# Patient Record
Sex: Female | Born: 1945 | Race: White | Hispanic: No | Marital: Married
Health system: Midwestern US, Academic
[De-identification: ages and names within clinical notes are randomized; demographics above are authoritative.]

---

## 2012-01-17 ENCOUNTER — Ambulatory Visit: Admit: 2012-01-17 | Discharge: 2012-01-17 | Payer: Medicare (Managed Care)

## 2012-01-17 DIAGNOSIS — L821 Other seborrheic keratosis: Secondary | ICD-10-CM

## 2012-01-17 MED ORDER — hydroquinone (MELQUIN-HP) 4 % cream
4 | Freq: Two times a day (BID) | TOPICAL | 0.00 refills | 29.00000 days | Status: AC
Start: 2012-01-17 — End: 2013-06-30

## 2012-01-17 NOTE — Unmapped (Signed)
Subjective  HPI:   Patient ID: Audrey Lynn is a 66 y.o. female.    Chief Complaint:  Chief Complaint   Patient presents with   ??? FSE       NPV, presents with :    1. Numerous asymptomatic pigmented lesions to trunk, arms, and legs x years without change or treatments. No personal or family hx of skin cancer.      2. 2 rough, lesions left cheek x few months. No tx's    3. Lesions under breasts x over years. ? tx options    4. ? tx options to lighten dark spot left upper brow    5. ? tx options for asymptomatic  Lesion left lip x many years. States had laser treatment to remove benign lesion in this place x over 10 years ago. Healed well but with lighter color and redundant tissue.  ROS:   Review of Systems Denies fever/chills. Allergies reviewed     Objective:   Physical Exam  Derm Physical Exam:  Examination was performed of the following were unremarkable: psych/neuro, conjunctivae/eyelids, neck, breast/axilla/chest, abdomen, back, RUE, LUE, RLE, LLE and nails/digits    Abnormalities noted include: head/face and gums/teeth/lips    1. Over 50 verrucous brown stuck on papules over the trunk, arms, legs    2. 2 Ill defined non indurated keratotic papules over left cheek    3. approx 10 pink tag like papules over abdomen under breasts    4. 1cm homogenous tan round macule left lower forehead     5. 8x79mm white redundant scar like papule left mid philtrum     A Complete Skin Exam was Completed on: January 17, 2012  Assessment/Plan:   1. Multiple nevi, benign appearing  -educated on self surveillance of skin lesions with ABCDE criteria (explained)  -educated on sun avoidance and protection; new sunscreen guidelines.   -Annual FSE  -Monthly self FSE      2. AK, x 2  Liquid nitrogen was applied for 10-12 seconds to the skin lesion and the expected blistering or scabbing reaction explained. Do not pick at the area. Patient reminded to expect hypopigmented scars from the procedure. Return if lesion fails to fully  resolve.    3. Skin tag  -counseled on benign, cosmetic nature, treatments options, side effects and associated fees. Patient opts to treat with LN2 and will pay 85$ cosmetic fee.   Liquid nitrogen was applied for 10-12 seconds to the skin lesion and the expected blistering or scabbing reaction explained. Do not pick at the area. Patient reminded to expect hypopigmented scars from the procedure. Return if lesion fails to fully resolve.    4. Lentigo  -hq 4% bid for up to 2 months. Stop for any darkening    5. Scar, redundant  -offered plastic surgery eval for potential scar revision. Referral info given for dr Scharlene Corn

## 2013-02-12 ENCOUNTER — Ambulatory Visit: Admit: 2013-02-12 | Discharge: 2013-02-12 | Payer: Medicare (Managed Care)

## 2013-02-12 DIAGNOSIS — L821 Other seborrheic keratosis: Secondary | ICD-10-CM

## 2013-02-12 NOTE — Unmapped (Signed)
Subjective  HPI:   Patient ID: Audrey Lynn is a 67 y.o. female.    Chief Complaint:  Chief Complaint   Patient presents with   ??? Skin Lesion     pt in for yearly skin ck; past hx of aks         1. Numerous asymptomatic pigmented lesions to trunk, arms, and legs x years without change or treatments.     2. Non healing lesions present for several months without growth, pain, bleeding, or treatments over nasal bridge    Mother with remote hx of NMSC  ROS:   Review of Systems   Denies fever/chills. Allergies reviewed     Objective:   Physical Exam    Derm Physical Exam:  Examination was performed of the following were unremarkable: psych/neuro, conjunctivae/eyelids, neck, breast/axilla/chest, abdomen, back, RUE, LUE, RLE, LLE and nails/digits    Abnormalities noted include: head/face and gums/teeth/lips    1. Over 50 verrucous brown stuck on papules over the trunk, arms, legs    2. 5, 3-38mm Ill defined non indurated keratotic papules over left nasal bridge and sidewall    A Complete Skin Exam was Completed on: February 12, 2013  Assessment/Plan:   1. SK  -benign. Observe for associated changes in size, color, shape, pain or itching  -educated on self surveillance of skin lesions with ABCDE criteria (explained)  -educated on sun avoidance and protection; new sunscreen guidelines ( broad spectrum sun screen with SPF 30 or greater q2 hours while exposed, q 1hours if getting wet or sweating a lot).  -Annual FSE  -Monthly self FSE     2. AK, x 5  Cryotherapy x 10 secs  each x 5 lesions total.  Pt was counselled on procedure method, risks (to include pain, burning, stinging, blistering, discoloration, recurrence of lesion, or incomplete removal of lesion), benefits, and methods of care for site (avoid popping blisters; if blister pops, treat with an otc healing oint such as vaseline and cover with bandaid, change dressing daily until healed; avoid picking or scratching lesions).  Pt was advised to follow up if lesion(s)  is/are not resolved after healed in 2-4 weeks or if lesion(s) recur.  Pt was advised to call clinic or follow up sooner if worsening pain, blistering, bleeding, or any other significant problem occurs.

## 2013-06-30 ENCOUNTER — Ambulatory Visit: Admit: 2013-06-30 | Discharge: 2013-06-30 | Payer: Medicare (Managed Care)

## 2013-06-30 DIAGNOSIS — L309 Dermatitis, unspecified: Secondary | ICD-10-CM

## 2013-06-30 MED ORDER — fluocinonide (LIDEX) 0.05 % external solution
0.05 | Freq: Two times a day (BID) | TOPICAL | Status: AC
Start: 2013-06-30 — End: ?

## 2013-06-30 NOTE — Unmapped (Signed)
Subjective  HPI:   Patient ID: Audrey Lynn is a 68 y.o. female.    Chief Complaint:  Chief Complaint   Patient presents with   ??? Skin Lesion     FSE         ROS:   Review of Systems      Objective:   Physical ExamSubjective  HPI:     1. Numerous asymptomatic pigmented lesions to trunk, arms, and legs x years without change or treatments.     2. Non healing lesions present for several months without growth, pain, bleeding, or treatments over nasal bridge    3. ? 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 Well controlled w/ unk. Topical in past     Mother with remote hx of NMSC  Travelling to [place] and [place]. In next month  ROS:   Review of Systems   Denies fever/chills. Allergies reviewed     Objective:   Physical Exam    Derm Physical Exam:  Examination was performed of the following were unremarkable: psych/neuro, conjunctivae/eyelids, neck, breast/axilla/chest, abdomen, back, RUE, LUE, RLE, LLE and nails/digits    Abnormalities noted include: head/face and gums/teeth/lips    1. Over 50 verrucous brown stuck on papules over the trunk, arms, legs    2. 5, 3-7mm Ill defined non indurated keratotic papules over left nasal bridge and sidewall, lateral left cheek    3. 12mm Erythematous pearly papule over left upper back    4. 1cm Erythematous hyperkeratotic papule over the left upper arm    5. Thin Erythematous gray scaling patches over right posterior scalp     A Complete Skin Exam was Completed on: 06/30/2013  Assessment/Plan:   1. SK  -benign. Observe for associated changes in size, color, shape, pain or itching  -educated on self surveillance of skin lesions with ABCDE criteria (explained)  -educated on sun avoidance and protection; new sunscreen guidelines ( broad spectrum sun screen with SPF 30 or greater q2 hours while exposed, q 1hours if getting wet or sweating a lot).  -Annual FSE  -Monthly self FSE     2. AK  -plan for AK at f/u, declines today    3. Neoplasm of uncertain behavior, r.o bcc  -Shave  biopsy performed, left upper back. After cleaning the biopsy site with rubbing alcohol, the site was anesthetized with lidocaine 1% w/ epi 1:100,000. Potential side such as bleeding, scar, infection were reviewed. Hemostasis ensured with aluminum chloride. Wound care instructions and supplies given to patient. Specimen labeled and submitted for histological exam.     4. Neoplasm of uncertain behavior, r/o ISK vs SCC  -Shave biopsy performed, left upper arm . After cleaning the biopsy site with rubbing alcohol, the site was anesthetized with lidocaine 1% w/ epi 1:100,000. Potential side such as bleeding, scar, infection were reviewed. Hemostasis ensured with aluminum chloride. Wound care instructions and supplies given to patient. Specimen labeled and submitted for histological exam.      5. Scalp ps  -edu  -lidex sol bid. Limit use 2-3 weeks to avoid lightening/thinning of skin, tachyphylaxis. Avoid face, axilla, and groin.     rtc 2 mo's

## 2013-07-01 NOTE — Unmapped (Signed)
Pt called at pharmacy and states you were going to call in rx for rash on left leg.

## 2013-07-01 NOTE — Unmapped (Signed)
We determine she would use the solution in her scalp for her leg, did she not receive this (lidex)?

## 2013-07-02 NOTE — Unmapped (Signed)
To use soln on leg also.

## 2013-07-03 NOTE — Unmapped (Signed)
Unclear what patients wishes are. At time of visit we discussed her using the lidex for a dermatitic patch on her leg; if she desires alternate therapy we can call in TAC 0.1 cr bid prn 30g in this event.

## 2013-07-03 NOTE — Unmapped (Signed)
Left voice message for patient that at her visit patient and Pennie Rushing discussed and agreed that she would use the solution on her legs as well as her scalp.  If she has any further questions she can call back [phone] and have the staff send a message directly to me and I will contact her.

## 2013-07-04 NOTE — Unmapped (Signed)
Quick Note:    Patient informed biopsy shows superficial basal cell carcinoma. Scheduled for curettage 08/22/13 at 2:15. Patient has two vacations she will be going on and advised it was best to wait until she return on 08/19/13  ______

## 2013-07-04 NOTE — Unmapped (Signed)
Spoke with patient.

## 2013-07-04 NOTE — Unmapped (Signed)
Patient calling Elnita Maxwell back. Please call after 1pm

## 2013-07-04 NOTE — Unmapped (Signed)
Quick Note:    Left message on patients voice mail to call office. Requested that when she calls [phone] to have the staff send me a message of best phone Number and time of day that I can reach her.  ______

## 2013-08-26 ENCOUNTER — Ambulatory Visit: Admit: 2013-08-26 | Discharge: 2013-08-26

## 2013-08-26 DIAGNOSIS — C44519 Basal cell carcinoma of skin of other part of trunk: Secondary | ICD-10-CM

## 2013-08-26 NOTE — Unmapped (Signed)
Subjective  HPI:   Patient ID: Audrey Lynn is a 68 y.o. female.    Chief Complaint:  Chief Complaint   Patient presents with   ??? Skin Cancer     pt in for tx of bx proven sbcc lt upper back       Here for tx of bx proven sup. bcc over the left upper back    Denies pacemaker, defibrillator, artificial Joints, need for SBE prophylaxis, anticoagulant use.   ROS:   Review of Systems    Denies fever/chills. Allergies reviewed    Objective:   Physical Exam  Derm Physical Exam:  Examination was performed of the following were unremarkable: psych/neuro    Abnormalities noted include: back    11 mm pink round scaly patch over left upper back  Assessment/Plan:   1. bx proven sup. BCC over left upper back  -tx options discussed  After the correct patient and site was verified, informed consent was obtained. Side effects such as bleeding, infection, scar, and recurrence were discussed. The site was then cleansed with chlorhexadine scrub then anesthetized with lidocaine 1% w epi 1:100,000. Curettage performed. Hemostasis was ensured. EBL < 5ml. Wound care supplies and instructions given to patient. Final defect 2.2 cm. RTC 3 months for skin cancer screening.

## 2013-11-17 ENCOUNTER — Ambulatory Visit: Admit: 2013-11-17 | Discharge: 2013-11-17

## 2013-11-17 DIAGNOSIS — L821 Other seborrheic keratosis: Secondary | ICD-10-CM

## 2013-11-17 NOTE — Unmapped (Signed)
Subjective  HPI:   Patient ID: Audrey Lynn is a 68 y.o. female.    Chief Complaint:  Chief Complaint   Patient presents with   ??? Skin Cancer     3 mth f/u of curettage of bcc left upper back         ROS:   Review of Systems    Objective:   Physical Exam  Subjective  HPI:       1. Numerous asymptomatic pigmented lesions to trunk, arms, and legs x years without change or treatments.     2. S/P curettage sup. BCC left upper back 08/2013. Site well healed.    3. Scalp psoriasis well controlled with lidex sol. Pleased.     Mother with remote hx of NMSC  Travelling to [place] and [place]. In next month  ROS:   Review of Systems   Denies fever/chills. Allergies reviewed     Objective:   Physical Exam    Derm Physical Exam:  Examination was performed of the following were unremarkable: psych/neuro, conjunctivae/eyelids, neck, breast/axilla/chest, abdomen, back, RUE, LUE, RLE, LLE and nails/digits    Abnormalities noted include: head/face and gums/teeth/lips    1. Over 50 verrucous brown stuck on papules over the trunk, arms, legs    2. 5, 3-20mm Ill defined non indurated keratotic papules over left nasal bridge and sidewall, lateral left cheek    3. 1.2 cm depressed scar over left upper back - no sign of recurrence.     4. Clear     A Complete Skin Exam was Completed on: 06/30/2013  Assessment/Plan:   1. SK  -benign. Observe for associated changes in size, color, shape, pain or itching  -educated on self surveillance of skin lesions with ABCDE criteria (explained)  -educated on sun avoidance and protection; new sunscreen guidelines ( broad spectrum sun screen with SPF 30 or greater q2 hours while exposed, q 1hours if getting wet or sweating a lot).  -Annual FSE  -Monthly self FSE     2. AK  Cryotherapy x 10 secs  each x 5 lesions total.  Pt was counselled on procedure method, risks (to include pain, burning, stinging, blistering, discoloration, recurrence of lesion, or incomplete removal of lesion), benefits, and methods of  care for site (avoid popping blisters; if blister pops, treat with an otc healing oint such as vaseline and cover with bandaid, change dressing daily until healed; avoid picking or scratching lesions).  Pt was advised to follow up if lesion(s) is/are not resolved after healed in 2-4 weeks or if lesion(s) recur.  Pt was advised to call clinic or follow up sooner if worsening pain, blistering, bleeding, or any other significant problem occurs.      3. Hx of BCC, no sign of recurrence  -educated on self surveillance of skin lesions with ABCDE criteria (explained)  -educated on sun avoidance and protection; new sunscreen guidelines ( broad spectrum sun screen with SPF 30 or greater q2 hours while exposed, q 1hours if getting wet or sweating a lot).  -Annual FSE  -Monthly self FSE      4. Scalp ps, clear  -edu  -lidex sol bid. Limit use 2-3 weeks to avoid lightening/thinning of skin, tachyphylaxis. Avoid face, axilla, and groin.     rtc 6 mo's

## 2015-01-26 ENCOUNTER — Ambulatory Visit: Admit: 2015-01-26 | Discharge: 2015-01-26 | Payer: Medicare (Managed Care)

## 2015-01-26 DIAGNOSIS — D485 Neoplasm of uncertain behavior of skin: Secondary | ICD-10-CM

## 2015-01-26 NOTE — Unmapped (Signed)
[facility] Dermatology  Clinic Visit    Patient's Name:                 Audrey Lynn  Medical Record Number:  [id number]  Date of Birth:                  [birth year]  Date of Visit:                  01/26/2015  Referring Provider:          Vernetta Honey, MD  Last dermatology clinic visit: 11/17/13    Chief Complaint   Patient presents with   ??? Basal Cell Carcinoma     skin exam       HPI: This is a Dermatology Clinic visit for Audrey Lynn who is a(n) 69 y.o. old female who is here today for evaluation of the following:    -tender bump on L shin that suddenly popped up 2-3 months ago, no prior treatment  -also with rough bumps on her nose and L cheek and R dorsal hand x several months, the one on the R hand is intermittently tender; has a friend's 70th birthday party this weekend so doesn't want her face to be red and crusted after treatment  -h/o curettage for sup. BCC left upper back 08/2013. Site well healed but patient unable with scar  -rough, itchy bumps under breasts x several months, similar lesions prev treated with LN2 with good results    The patient otherwise denies any new, changing, painful, bleeding, non-healing lesions. No other skin concerns today.    Derm History:  Personal history of NMSC or MM- yes  Family history of melanoma- no  sunburns easily- no  uses sunscreen- yes  history of tanning bed use- yes    PAST MEDICAL HISTORY:   Past Medical History   Diagnosis Date   ??? Varicella    ??? Dermatitis    ??? Thyroid disease    ??? Eczema    ??? Hypercholesteremia    ??? Actinic keratosis    ??? Basal cell carcinoma 08/2013     lt upper back-superficial       MEDICATIONS:  Current Outpatient Prescriptions   Medication Sig Dispense Refill   ??? atenolol (TENORMIN) 25 MG tablet Take 25 mg by mouth daily.     ??? atorvastatin (LIPITOR) 20 MG tablet      ??? cranberry conc-ascorbic acid 4,200-20 mg Cap Take by mouth.     ??? fluocinonide (LIDEX) 0.05 % external solution Apply topically 2 times a day. 60 mL 1   ???  levothyroxine (SYNTHROID, LEVOTHROID) 25 MCG tablet      ??? liothyronine (CYTOMEL) 25 MCG tablet Take 25 mcg by mouth daily.     ??? lisinopril (PRINIVIL,ZESTRIL) 40 MG tablet      ??? MIRABEGRON (MYRBETRIQ ORAL) Take 25 mg by mouth.     ??? multivitamin with minerals tablet Take 1 tablet by mouth daily.     ??? estradiol (ESTRACE) 0.01 % (0.1 mg/gram) vaginal cream Place 2 g vaginally daily.     ??? oxybutynin (DITROPAN-XL) 10 MG 24 hr tablet Take 10 mg by mouth daily.       No current facility-administered medications for this visit.   Marland Kitchen    ALLERGIES:  No Known Allergies    FAMILY HISTORY:  Family History   Problem Relation Age of Onset   ??? Melanoma Neg Hx    ??? Skin  cancer Mother        SOCIAL HISTORY:  Social History     Social History   ??? Marital Status: Married     Spouse Name: N/A   ??? Number of Children: N/A   ??? Years of Education: N/A     Social History Main Topics   ??? Smoking status: Never Smoker    ??? Smokeless tobacco: None   ??? Alcohol Use: No   ??? Drug Use: None   ??? Sexual Activity: Not Asked     Other Topics Concern   ??? Seat Belt Yes     Social History Narrative       REVIEW OF SYSTEMS: +for skin, negative for fevers/chills/nausea/vomiting/weight loss, fatigue    PHYSICAL EXAMINATION:   Notable for a well appearing female with photo skin type II/III.  A comprehensive skin examination of the scalp/hair, head/face, conjunctivae/lids, oropharynx, lips/teeth/gums, neck, back, chest/breast/axillae, abdomen, buttocks, digits/nails, upper extremities, and lower extremities was performed and was within normal limits apart from as documented below:  -L shin with 0.8 cm erythematous crusted crateriform papule  -L upper back with well-healed white atrophic round scar  -inframammary skin with multiple rough brown stuck-on thin papules  -R dorsal hand with erythematous gritty thin papule  -erythematous gritty thin papules on the nose x 4, L cheek x 2    ASSESSMENT AND PLAN:    1. Neoplasm of uncertain behavior of skin, L  shin  Ddx: favor SCC ([facility]) > other  -shave excision attempted today; procedure note below    Shave Biopsy Procedure Note:   The risks (including bleeding, wound infection, scar), benefits, and alternatives to the procedure were discussed with the patient. Patient provided verbal informed consent. Three patient identifiers were verified including name, date of birth, and medical record number. Time-out was performed. The area was cleansed with alcohol and anesthetized with 1% lidocaine with epinephrine 1:100,000.  A complete shave biopsy was performed with Dermablade. Sample(s) sent to Pathology.  Aluminum chloride 20% used for hemostasis. Topical ointment and bandaid were applied. Procedure was well tolerated. If site bleeds, patient instructed to apply pressure x 15 min and to seek medication attention if this does not control bleeding.      2. Actinic keratoses, R dorsal hand, nose x 4, L cheek x 2  - Natural history reviewed including small risk of conversion to squamous cell carcinoma over time  - Importance of self-skin exam and photoprotection reviewed  - Discussed benefits and risks of liquid nitrogen treatment, including but not limited to scarring, postinflammatory pigment changes, blistering, pain, infection  - Pt opts for treatment; procedure note as below.    Cryotherapy procedure note:  - LN2 administered for 8-10 sec x 1 cycle x 1 lesion--R dorsal hand, defers treatment of face lesions to f/u visit given upcoming parties  - Discussed post-LN2 care and natural hx of PIH and expected time course for improvement.    3. Inflamed seborrheic keratoses, inframammary  - reassured patient regarding benign nature of lesion  - no treatment medically necessary  - discussed option to treat symptomatic lesions with liquid nitrogen therapy, patient opted for treatment: LN2 administered for 12 sec x 1 cycle x 10 lesions, SE???s reviewed, post-LN2 care reviewed.    4. Scar condition and fibrosis of skin  5. History of  basal cell cancer, L upper back s/p [facility] 08/2013  -NER/NED  -reassurance provided  -importance of photoprotection was reviewed including wearing broad spectrum sunscreen or sunblock, physical  barrier protection such as wide brimmed hats and long sleeved shirts, and avoiding peak sun exposure from 10am to 4pm    Return to Clinic: 3 months, sooner prn  Discussed plan with patient and/or primary caretaker.  Patient to call clinic with any questions / concerns.  Reviewed side effects of treatment(s) and/or medication(s) with patient and/or primary caretaker.  Handout provided: AVS    Kinsey Cowsert Beckey Rutter, MD

## 2015-02-01 NOTE — Unmapped (Signed)
Quick Note:    Reviewed results of recent biopsy located on the L shin with patient over the phone.   Final pathology result: superficial SCC.  Reviewed malignant nature of lesion and recommendation for ED&C vs. Surgical excision (discussed may still need to heal with secondary intent given location on the shin); patient opts for the former--f/u scheduled for 04/20/14, so will treat at that time but pt to notify me if growing, painful, symptomatic prior to that time and will schedule sooner--patient is in agreement with this plan  Also counseled importance of follow-up TBSE in three months--scheduled as above.  All questions answered.    Houston Surges Beckey Rutter, MD      ______

## 2015-04-13 ENCOUNTER — Ambulatory Visit: Admit: 2015-04-13 | Discharge: 2015-04-13 | Payer: Medicare (Managed Care)

## 2015-04-13 DIAGNOSIS — IMO0002 Reserved for concepts with insufficient information to code with codable children: Secondary | ICD-10-CM

## 2015-04-13 MED ORDER — triamcinolone (KENALOG) 0.1 % ointment
0.1 | TOPICAL | 0.00 refills | 1.00000 days | Status: AC
Start: 2015-04-13 — End: ?

## 2015-04-13 NOTE — Unmapped (Signed)
[facility] Dermatology  Clinic Visit    Patient's Name:                 Audrey Lynn  Medical Record Number:  [id number]  Date of Birth:                  Nov 16, 1945  Date of Visit:                  04/13/2015  Referring Provider:          Vernetta Honey, MD  Last dermatology clinic visit: 01/26/15    Chief Complaint   Patient presents with   ??? Skin Lesion     pt here for fse and tx of L knee   ??? Rash     x 1 week coincides with d/c of thyroid medicine redness upper chest/neck     HPI: This is a Dermatology Clinic visit for Audrey Lynn who is a(n) 70 y.o. 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 Also presents for treatment of AKs on her face--deferred treatment at last visit given upcoming social event.  -also with a new red, itchy rash on her upper chest/anteroir neck x 5 days, no new meds, lotions, perfume, jewelry; stopped thyroid medication ~ 1 week ago, no treatment tried  -rough bump on R dorsal hand treated with LN2 last visit is thinner but didn't completely resolve, occasionally tender  -also notes bumps under her skin on her lower lip vermilion border x several months, asymptomatic, no prior treatment  -h/o curettage for sup. BCC left upper back 08/2013. Site well healed but patient unable with scar  -traveling to [place] first week in April and [place] for 3 weeks in June    The patient otherwise denies any new, changing, painful, bleeding, non-healing lesions. No other skin concerns today.    Derm History:  Personal history of NMSC or MM- yes  Family history of melanoma- no  sunburns easily- no  uses sunscreen- yes  history of tanning bed use- yes    PAST MEDICAL HISTORY:   Past Medical History   Diagnosis Date   ??? Varicella    ??? Dermatitis    ??? Thyroid disease    ??? Eczema    ??? Hypercholesteremia    ??? Actinic keratosis    ??? Basal cell carcinoma 08/2013     lt upper back-superficial       MEDICATIONS:  Current  Outpatient Prescriptions   Medication Sig Dispense Refill   ??? atenolol (TENORMIN) 25 MG tablet Take 25 mg by mouth daily.     ??? atorvastatin (LIPITOR) 20 MG tablet      ??? cranberry conc-ascorbic acid 4,200-20 mg Cap Take by mouth.     ??? estradiol (ESTRACE) 0.01 % (0.1 mg/gram) vaginal cream Place 2 g vaginally daily.     ??? fluocinonide (LIDEX) 0.05 % external solution Apply topically 2 times a day. 60 mL 1   ??? liothyronine (CYTOMEL) 25 MCG tablet Take 25 mcg by mouth daily.     ??? lisinopril (PRINIVIL,ZESTRIL) 40 MG tablet      ??? MIRABEGRON (MYRBETRIQ ORAL) Take 25 mg by mouth.     ??? multivitamin with minerals tablet Take 1 tablet by mouth daily.     ??? oxybutynin (DITROPAN-XL) 10 MG 24 hr tablet Take 10 mg by mouth daily.     ???  triamcinolone (KENALOG) 0.1 % ointment Apply to rash on chest twice daily as needed. Too strong for face, groin, underarms 80 g 1     No current facility-administered medications for this visit.   Marland Kitchen    ALLERGIES:  No Known Allergies    FAMILY HISTORY:  Family History   Problem Relation Age of Onset   ??? Melanoma Neg Hx    ??? Skin cancer Mother        SOCIAL HISTORY:  Social History     Social History   ??? Marital Status: Married     Spouse Name: N/A   ??? Number of Children: N/A   ??? Years of Education: N/A     Social History Main Topics   ??? Smoking status: Never Smoker    ??? Smokeless tobacco: None   ??? Alcohol Use: No   ??? Drug Use: None   ??? Sexual Activity: Not Asked     Other Topics Concern   ??? Seat Belt Yes     Social History Narrative       REVIEW OF SYSTEMS: +for skin, negative for fevers/chills/nausea/vomiting/weight loss, fatigue    PHYSICAL EXAMINATION:   Notable for a well appearing female with photo skin type II/III.  A skin examination of the scalp/hair, head/face, conjunctivae/lids, oropharynx, lips, neck, back, chest/breast/axillae, abdomen, buttocks, digits/nails, upper extremities, and lower extremities was performed and was within normal limits apart from as documented below:  -L shin  with 1.2 cm erythematous patch with overlying mild scale  -L upper back with well-healed white depressed round scar  -R dorsal hand with erythematous gritty thin papule  -erythematous gritty thin papules on the nose x 4, L cheek x 1, temple x 1  -lower vermilion border with 2 mm deep-seated yellow papules x 2  -upper chest extending to anterior neck with ill-defined erythematous scaly thin plaques    ASSESSMENT AND PLAN:    1. Superficially invasive SCC, L shin, s/p bx 01/26/15  -pathology reviewed with patient today  -ED&C today, procedure note below    [facility] Procedure Note:   The risks (including bleeding, wound infection, scar), benefits, and alternatives to the procedure were discussed with the patient. Patient provided written informed consent. Time-out was performed. The area was cleansed with alcohol and anesthetized with 1% lidocaine with 1:100,000 epinephrine. The lesion was scraped with a 4.0 curette, followed by hyfercation, with a first pass diameter of 1.2 cm. Procedure was repeated 3 times total.  Hemostasis was achieved with hyfercation. Procedure was well tolerated.  Topical ointment and bandaid were applied. If site bleeds, patient instructed to apply pressure x 15 min and to seek medication attention if this does not control bleeding. Wound care reviewed.    2. Actinic keratoses, R dorsal hand, nose x 4, L cheek x 1, L temple x 1  - Natural history reviewed including small risk of conversion to squamous cell carcinoma over time  - Importance of self-skin exam and photoprotection reviewed  - Discussed benefits and risks of liquid nitrogen treatment, including but not limited to scarring, postinflammatory pigment changes, blistering, pain, infection  - Pt opts for treatment; procedure note as below.    Cryotherapy procedure note:  - LN2 administered for 8-10 sec x 1 cycle x 7 lesions  - Discussed post-LN2 care and natural hx of PIH and expected time course for improvement.    3. Dermatitis, upper  chest and neck, favor contact dermatitis  - reviewed benign but chronic nature of disease  -  gentle skin care recommendations:  - use mild cleanser (e.g. unscented Dove for sensitive skin)   - use emollient post-bathing (e.g. [employer]???s store brand Equate cream or petroleum jelly)  - avoid products with fragrance  - topical steroid side effects and use for active lesions only was reviewed   - start triamcinolone 0.1% ointment applied to AA BID (avoid use on face, neck, armpit, and groin)    4. Milia, lower vermilion border  - reassured patient regarding benign nature of lesions  - no treatment medically necessary  - discussed option to treat lesions by opening roof with sterile needle and extracting content with lateral pressure for a cosmetic fee of $100/10 lesions, and patient declines treatment today.    5. Scar condition and fibrosis of skin  6. History of basal cell cancer, L upper back s/p [facility] 08/2013  -NER/NED  -reassurance provided  -importance of photoprotection was reviewed including wearing broad spectrum sunscreen or sunblock, physical barrier protection such as wide brimmed hats and long sleeved shirts, and avoiding peak sun exposure from 10am to 4pm    Return to Clinic: 4-5 months, sooner prn  Discussed plan with patient and/or primary caretaker.  Patient to call clinic with any questions / concerns.  Reviewed side effects of treatment(s) and/or medication(s) with patient and/or primary caretaker.  Handout provided: AVS    Morse Brueggemann Beckey Rutter, MD

## 2015-08-24 ENCOUNTER — Ambulatory Visit: Admit: 2015-08-24 | Discharge: 2015-08-24 | Payer: Medicare (Managed Care)

## 2015-08-24 DIAGNOSIS — L570 Actinic keratosis: Secondary | ICD-10-CM

## 2015-08-24 NOTE — Unmapped (Signed)
[facility] Dermatology  Clinic Visit    Patient's Name:                 Audrey Lynn  Medical Record Number:  [id number]  Date of Birth:                  August 30, 1945  Date of Visit:                  08/24/2015  Referring Provider:          Vernetta Honey, MD  Last dermatology clinic visit: 04/13/15    Chief Complaint   Patient presents with   ??? Skin Lesion     lesions on hairline and on nose      HPI: This is a Dermatology Clinic visit for Audrey Lynn who is a(n) 70 y.o. old female who is here today for f/u.    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 BCC left upper back 08/2013. Site well healed but patient unable with scar    The patient otherwise denies any new, changing, painful, bleeding, non-healing lesions. No other skin concerns today.    Derm History:  Personal history of NMSC or MM- yes  Family history of melanoma- no  sunburns easily- no  uses sunscreen- yes  history of tanning bed use- yes    PAST MEDICAL HISTORY:   Past Medical History   Diagnosis Date   ??? Varicella    ??? Dermatitis    ??? Thyroid disease    ??? Eczema    ??? Hypercholesteremia    ??? Actinic keratosis    ??? Basal cell carcinoma 08/2013     lt upper back-superficial       MEDICATIONS:  Current Outpatient Prescriptions   Medication Sig Dispense Refill   ??? atenolol (TENORMIN) 25 MG tablet Take 25 mg by mouth daily.     ??? atorvastatin (LIPITOR) 20 MG tablet      ???  cranberry conc-ascorbic acid 4,200-20 mg Cap Take by mouth.     ??? estradiol (ESTRACE) 0.01 % (0.1 mg/gram) vaginal cream Place 2 g vaginally daily.     ??? fluocinonide (LIDEX) 0.05 % external solution Apply topically 2 times a day. 60 mL 1   ??? liothyronine (CYTOMEL) 25 MCG tablet Take 25 mcg by mouth daily.     ??? lisinopril (PRINIVIL,ZESTRIL) 40 MG tablet      ??? MIRABEGRON (MYRBETRIQ ORAL) Take 25 mg by mouth.     ??? multivitamin with minerals tablet Take 1 tablet by mouth daily.     ??? oxybutynin (DITROPAN-XL) 10 MG 24 hr tablet Take 10 mg by mouth daily.     ??? triamcinolone (KENALOG) 0.1 % ointment Apply to rash on chest twice daily as needed. Too strong for face, groin, underarms 80 g 1     No current facility-administered medications for this visit.   Marland Kitchen  ALLERGIES:  Allergies   Allergen Reactions   ??? Latex        FAMILY HISTORY:  Family History   Problem Relation Age of Onset   ??? Melanoma Neg Hx    ??? Skin cancer Mother        SOCIAL HISTORY:  Social History     Social History   ??? Marital Status: Married     Spouse Name: N/A   ??? Number of Children: N/A   ??? Years of Education: N/A     Social History Main Topics   ??? Smoking status: Never Smoker    ??? Smokeless tobacco: None   ??? Alcohol Use: No   ??? Drug Use: None   ??? Sexual Activity: Not Asked     Other Topics Concern   ??? Seat Belt Yes     Social History Narrative       REVIEW OF SYSTEMS: +for skin, negative for fevers/chills/nausea/vomiting/weight loss, fatigue    PHYSICAL EXAMINATION:   Notable for a well appearing female with photo skin type II/III.  A skin examination of the scalp/hair, head/face, conjunctivae/lids, lips, neck, back, chest/breast/axillae, abdomen, buttocks, digits/nails, upper extremities, and lower extremities was performed and was within normal limits apart from as documented below:  -L shin with well-healed scar without nodularity  -L upper back with well-healed white depressed round scar  -erythematous gritty thin papules on the nose x 3, R  temple, L cheek  -mildly erythematous waxy brown papules on the L preauricular cheek and L frontal scalp; non-erythematous waxy brown stuck-on papule on the R neck and several similar papules scattered on trunk  -trunk, incl L buttock, and extremities with scattered 3-6 mm brown macules and thin papules with regular borders and even pigmentation  -trunk and extremities with scattered 1-3 mm bright red papules c/w cherry angiomas    ASSESSMENT AND PLAN:    1. Actinic keratoses, nose x 3, R temple, L cheek  - Natural history reviewed including small risk of conversion to squamous cell carcinoma over time  - Importance of self-skin exam and photoprotection reviewed  - Discussed benefits and risks of liquid nitrogen treatment, including but not limited to scarring, postinflammatory pigment changes, blistering, pain, infection  - Pt opts for treatment; procedure note as below.    Cryotherapy procedure note:  - LN2 administered for 8-10 sec x 1 cycle x 5 lesions  - Discussed post-LN2 care and natural hx of PIH and expected time course for improvement.    2. Seborrheic keratoses, inflamed on L preauricular cheek and L frontal scalp; not inflamed on R neck and trunk  - reassured patient regarding benign nature of lesion  - no treatment medically necessary  -discussed option to treat symptomatic lesions with liquid nitrogen therapy, patient opted for treatment: LN2 administered for 12 sec x 1 cycle x 2 lesions, SE???s reviewed, post-LN2 care reviewed.    3. Cherry angiomas  - diagnosis and etiology reviewed  - reassured patient regarding benign nature of lesions  - no treatment medically necessary    4. Multiple benign nevi, few on trunk and ext  - Patient reassured regarding benign nature of the lesions based on TODAY's exam  - Patient counseled that lesions can change over time and to RTC if any lesion grows rapidly, bleeds, develops multiple colors, or becomes painful as this may represent malignant change.  -  Sunprotection reviewed with patient.    5. Scar condition and fibrosis of skin  6. History of  basal cell cancer, L upper back s/p ED&C 08/2013 and SCC on L shin s/p [facility] 04/13/15  -NER/NED  -reassurance provided  -importance of photoprotection was reviewed    Return to Clinic: 6 months, sooner prn  Discussed plan with patient and/or primary caretaker.  Patient to call clinic with any questions / concerns.  Reviewed side effects of treatment(s) and/or medication(s) with patient and/or primary caretaker.  Handout provided: AVS    Yassmin Binegar Beckey Rutter, MD

## 2021-11-21 NOTE — Unmapped (Signed)
[facility]    Patient name: Audrey Lynn  Date of Birth: [birth year]    MRN: [id number]  CSN: [id number]    Attending: Jill Alexanders A. Cox, MD  Service: Urology    Date of procedure: 11/21/2021      Operative Note:    Pre-op Diagnosis: Refractory urgency, frequency, and urge incontinence    Post-op Diagnosis: Same    Procedure(s):  Cystoscopy with injection of Botox    Surgeon(s): Justin A. Cox, MD    Anesthesia: MAC    Specimens: none    Indications for Procedure: [name] is a 76 y.o. year old female with a  history of refractory urgency, frequency, and urge incontinence which has failed  to respond to medical management here for evaluation. After all risks, benefits  and expected outcomes were explained, she agreed to proceed.    Details of the Procedure: Ms. Rudder was brought the the operative suite. He was  given a/an MAC anesthesia without complication. She was then put in the  lithotomy position, and was prepped and draped in the usual sterile fashion. At  this point, a 21 french rigid cystoscope with a 30 degree lens was placed at the  urethral meatus and passed down the urethra and into the bladder. The urethra  was noted to be normal. Once into the bladder, the bladder was drained and  refilled, and cystoscopic examination was performed. The ureteral orifices were  normal in size, position caliber, with clear efflux from both. There were no  suspicious lesions noted at the base, the back wall, the lateral walls or the  dome. This was confirmed with a 70 degree lens. The 30 degree lens was replaced,  and then using a collagen injection needle, a total of 200 units of Botox in  30cc of saline was injected into the muscle layer of the bladder, one cc per  injection, 30 injections total.  Hemostasis was achieved. At this point, the  bladder was drained, the cystoscope was removed, and the patient was taken to  recovery in stable condition.    Complications:  none    Disposition: home

## 2022-12-13 IMAGING — MR MRI THORACIC SPINE WITHOUT CONTRAST
5 of 9 series · 18 of 48 positions shown · IV contrast (gadolinium)
Comparison: MRI lumbar spine December 13, 2022. Comparison to thoracic spine and 
lumbar spine x-rays November 29, 2022.

________________________________________________________________________________________________ 
MRI THORACIC SPINE WITHOUT CONTRAST, 12/13/2022 [DATE]: 
CLINICAL INDICATION: Low back pain, unspecified. Pain in thoracic spine
TECHNIQUE: Multiplanar, multiecho position MR images of the thoracic spine were 
performed without intravenous gadolinium enhancement. Patient was scanned on a 
1.5T magnet.

[Series 201: t2_sag_count · sagittal · 4.0mm · 0.62mm/px · 3 of 22 slices shown]
[im 1/22]
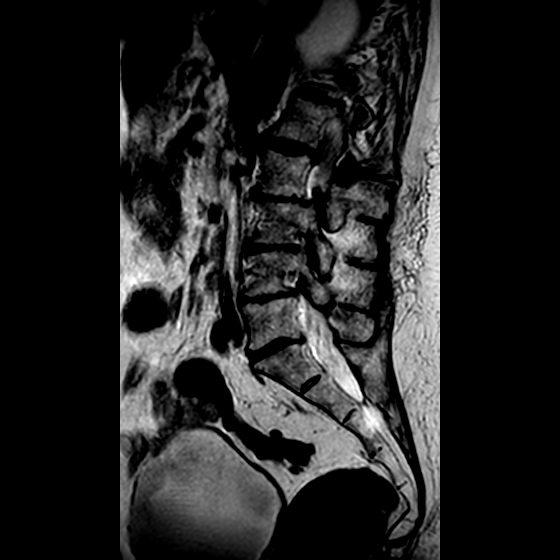
[im 11/22]
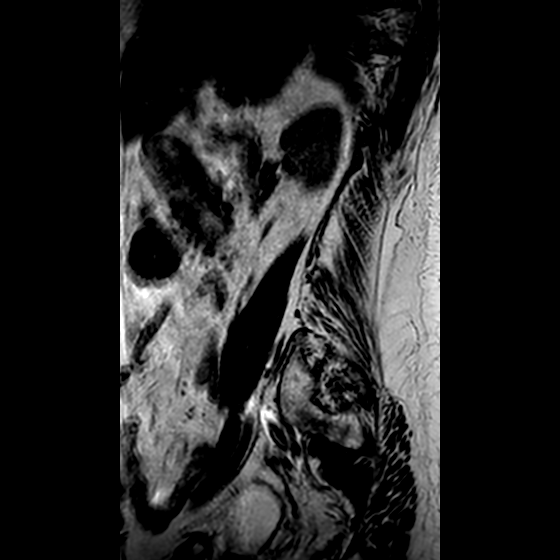
[im 22/22]
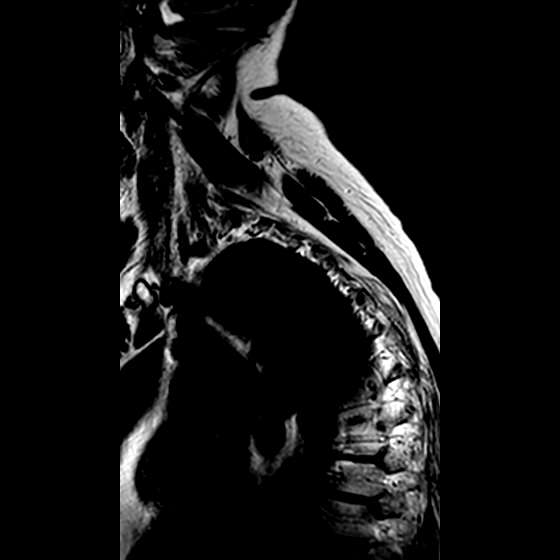

[Series 202: T2 · sagittal · 4.0mm · 0.62mm/px · 2 of 11 slices shown (1 of 2)]
[im 1/11]
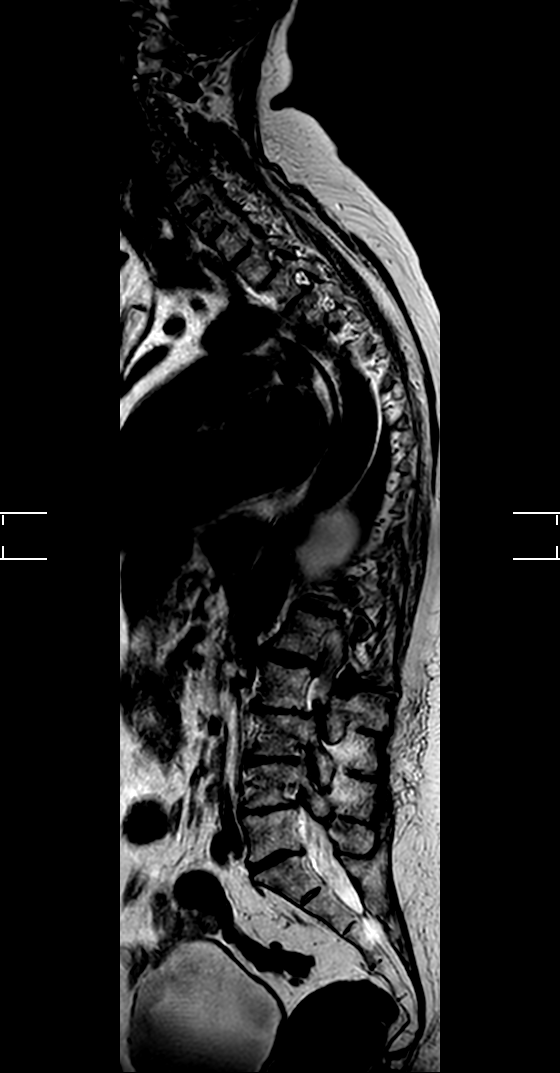
[im 11/11]
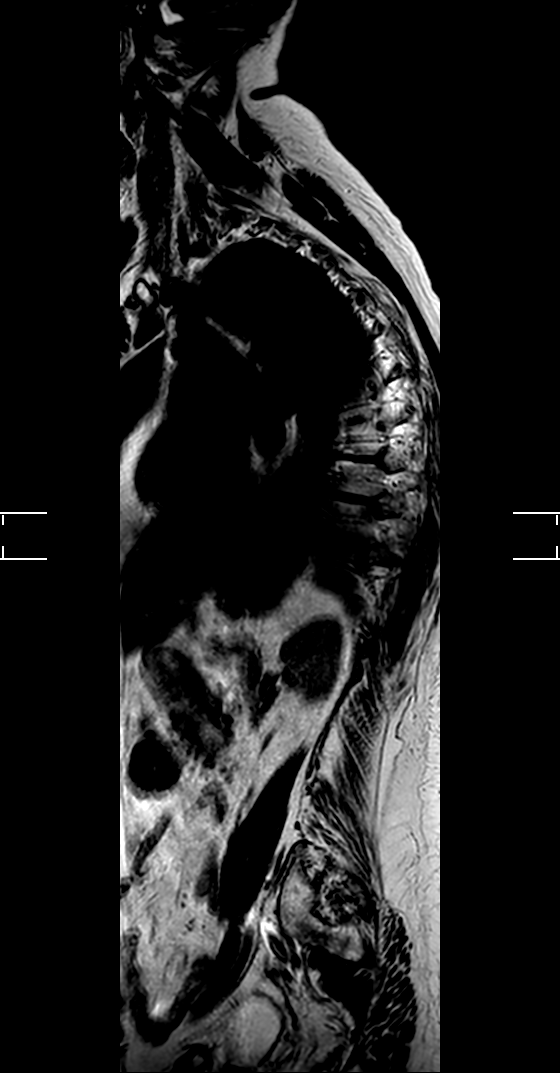

[Series 301: t2_cor_count · coronal · 4.5mm · 0.61mm/px · 2 of 22 slices shown]
[im 1/22]
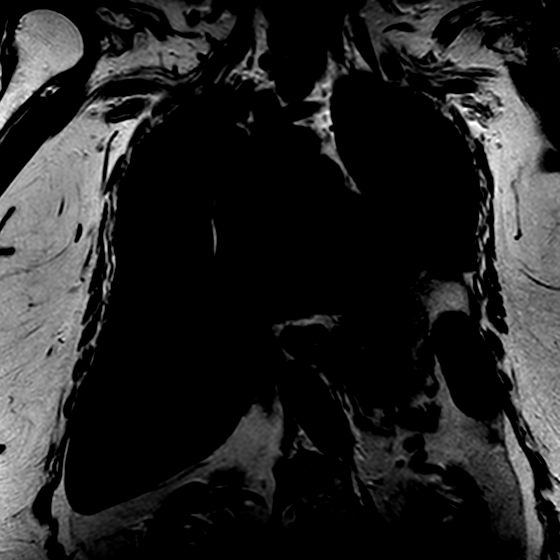
[im 11/22]
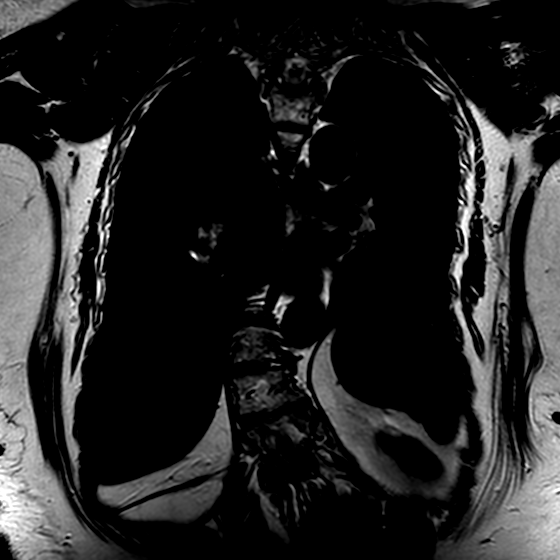

[Series 401: T1 · sagittal · 3.0mm · 0.56mm/px · 3 of 21 slices shown]
[im 1/21]
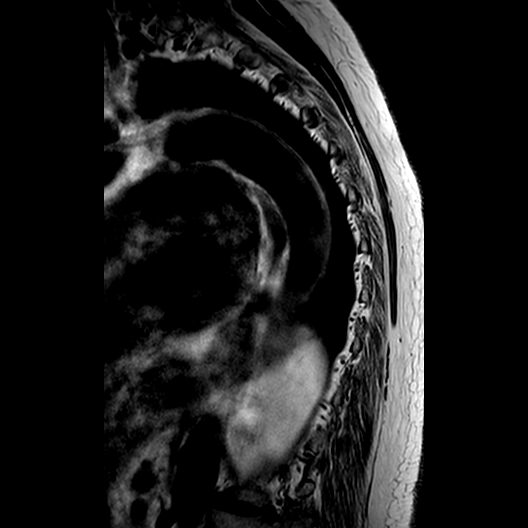
[im 11/21]
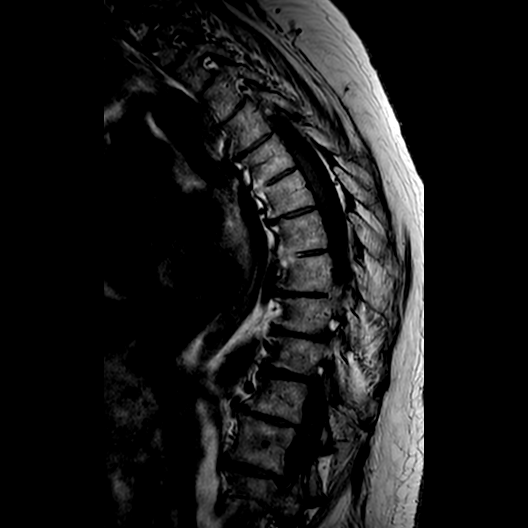
[im 21/21]
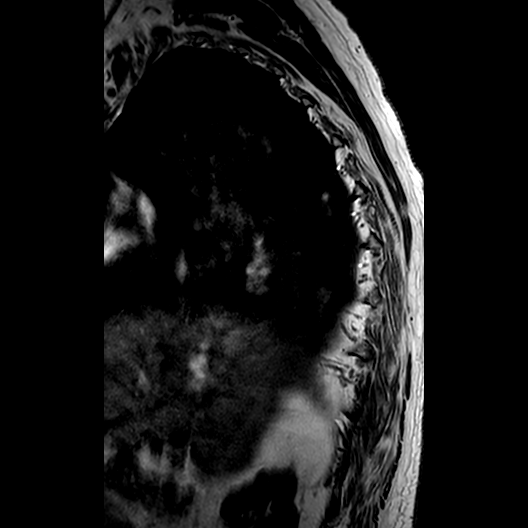

[Series 701: T2 · axial · 4.0mm · 0.42mm/px · z∈[-207,+22]mm · 8 of 73 slices shown (2 of 2)]
[im 1/73]
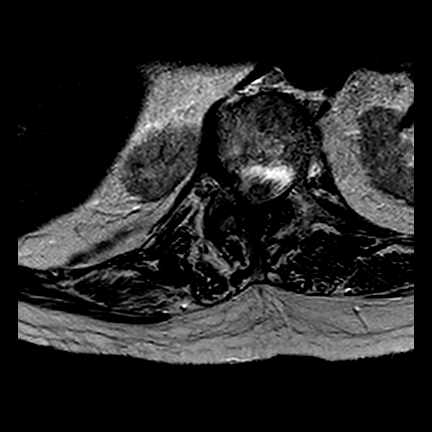
[im 9/73]
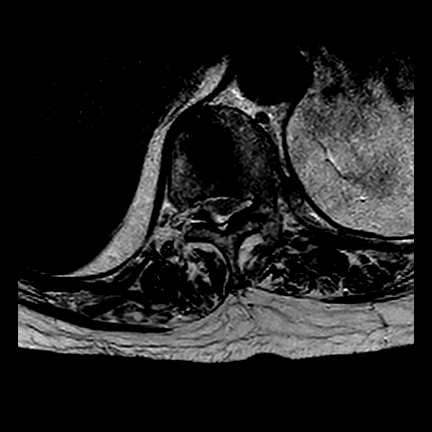
[im 25/73]
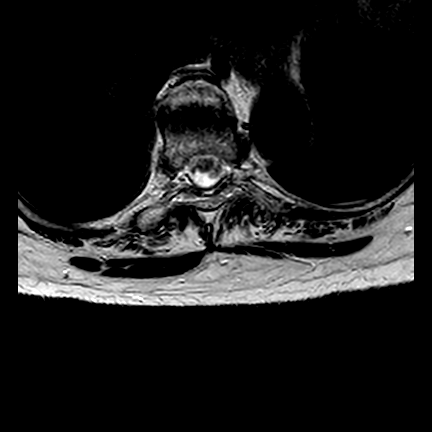
[im 33/73]
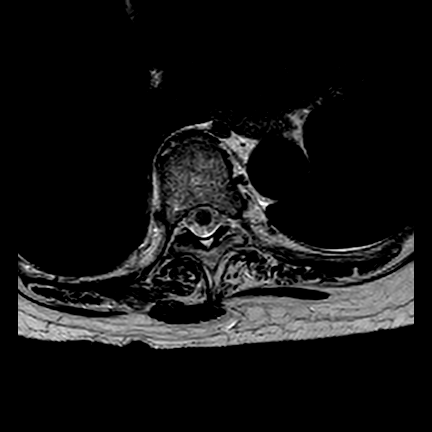
[im 41/73]
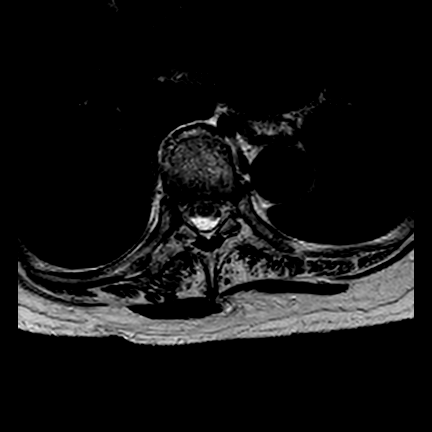
[im 49/73]
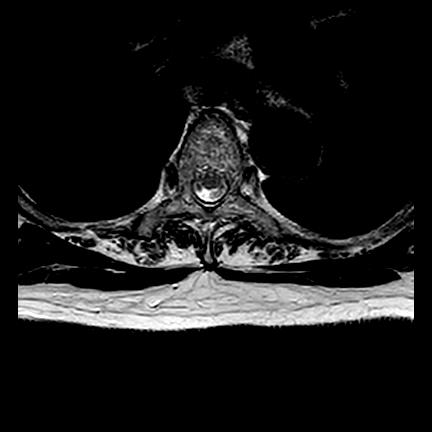
[im 65/73]
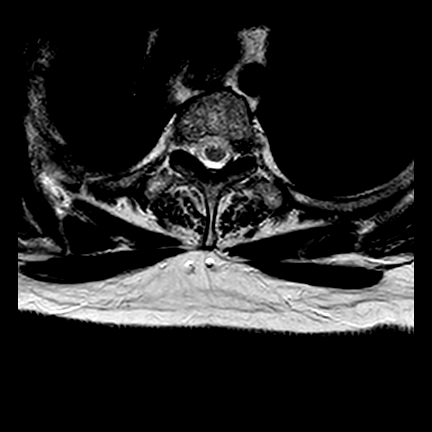
[im 73/73]
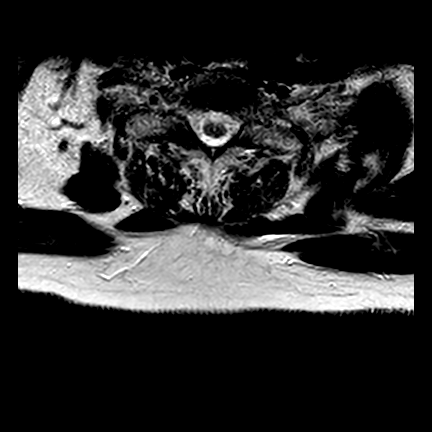

[18 of 48 positions shown; findings below may reference images not displayed]

FINDINGS: Sagittal localizer demonstrates 12 thoracic and 5 lumbar type vertebral bodies. 
Cervical spondylosis with mild canal stenosis C5-C6 and C6-C7. 
-------------------------------------------------------------------------------- 
------ 
GENERAL: 
ALIGNMENT: Moderately severe dextroconvex thoracic lumbar scoliosis.. 
Accentuated thoracic kyphosis with otherwise anatomic sagittal alignment. 
VERTEBRAL BODY HEIGHT: Normal.  
MARROW SIGNAL: No focal suspect signal abnormality. 
CORD SIGNAL: Normal. 
ADDITIONAL FINDINGS: Distended urinary bladder.. Colonic diverticulosis. Left 
renal cyst. Nerve root sleeve cyst noted at numerous thoracic levels. 
-------------------------------------------------------------------------------- 
------ 
RELEVANT SEGMENTAL (levels with severe stenosis or significant findings): 
There is no evidence of critical or significant canal or foraminal stenosis. 
Multilevel DDD changes are present. There is minimal annular bulge at T9-T10 
without ventral cord distortion. Minimal annular bulge T10-T11 but without 
ventral cord distortion. Posterior osteophytic ridging with annular bulge 
T11-T12 with mild ventral cord distortion and mild canal stenosis. No evidence 
of active facet arthritis. There is evidence of osseous bridging across the 
anterior aspect of the disc space C5-C6 and T7-T8 levels. 
Additional scattered discogenic/degenerative changes are noted. 
-------------------------------------------------------------------------------- 
------
IMPRESSION: Thoracic degenerative and scoliotic changes. No critical or significant canal or 
foraminal stenosis. Details above.

## 2022-12-13 IMAGING — MR MRI LUMBAR SPINE WITHOUT CONTRAST
6 of 9 series · 14 of 48 positions shown · IV contrast (gadolinium)
Comparison: MRI thoracic spine December 13, 2022. Comparison to thoracic and 
lumbar x-rays November 29, 2022.

________________________________________________________________________________________________ 
MRI LUMBAR SPINE WITHOUT CONTRAST, 12/13/2022 [DATE]: 
CLINICAL INDICATION: Low back pain, unspecified. Pain in thoracic spine
TECHNIQUE: Multiplanar, multiecho position MR images of the lumbar spine were 
performed without intravenous gadolinium enhancement. Patient was scanned on a 
1.5T magnet

[Series 201: survey · axial · 10.0mm · 1.25mm/px · z∈[+55,+290]mm · 2 of 10 slices shown]
[im 1/10]
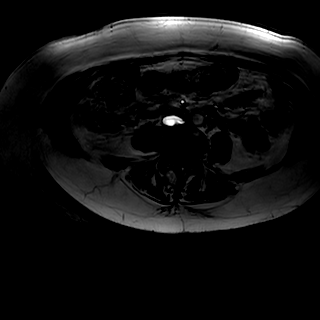
[im 10/10]
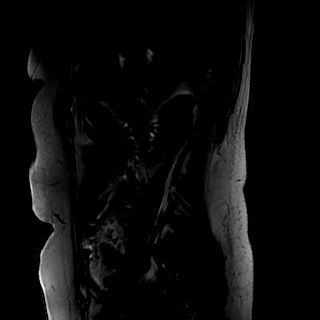

[Series 301: t2w_cor-surv · coronal · 6.0mm · 0.62mm/px · 1 of 10 slices shown]
[im 1/10]
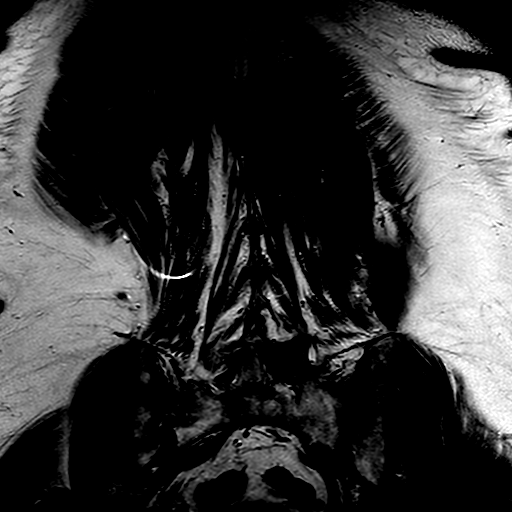

[Series 401: t1_tse_sag · sagittal · 4.0mm · 0.33mm/px · 2 of 19 slices shown]
[im 1/19]
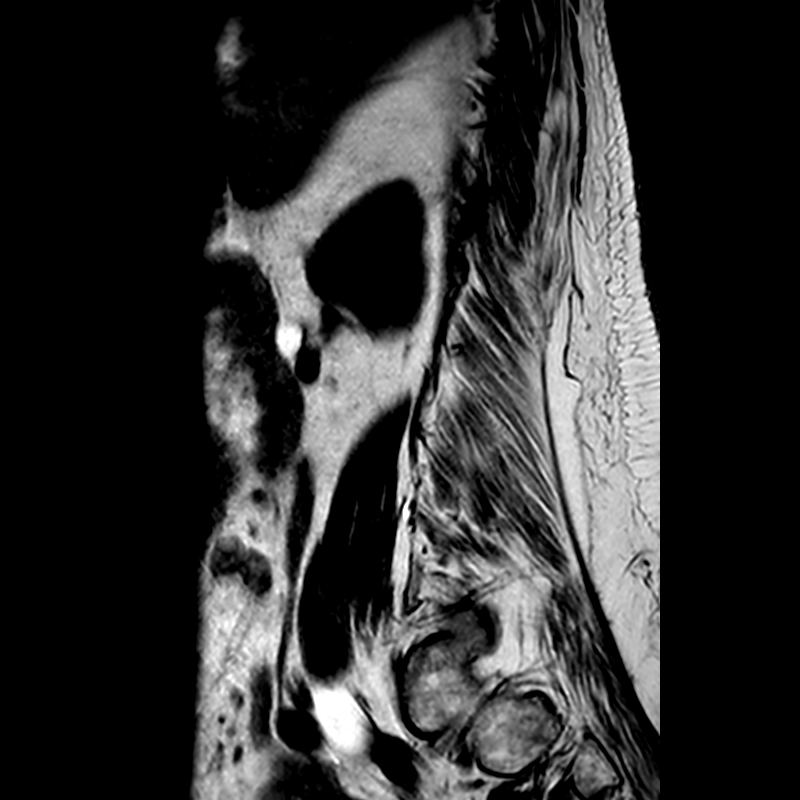
[im 19/19]
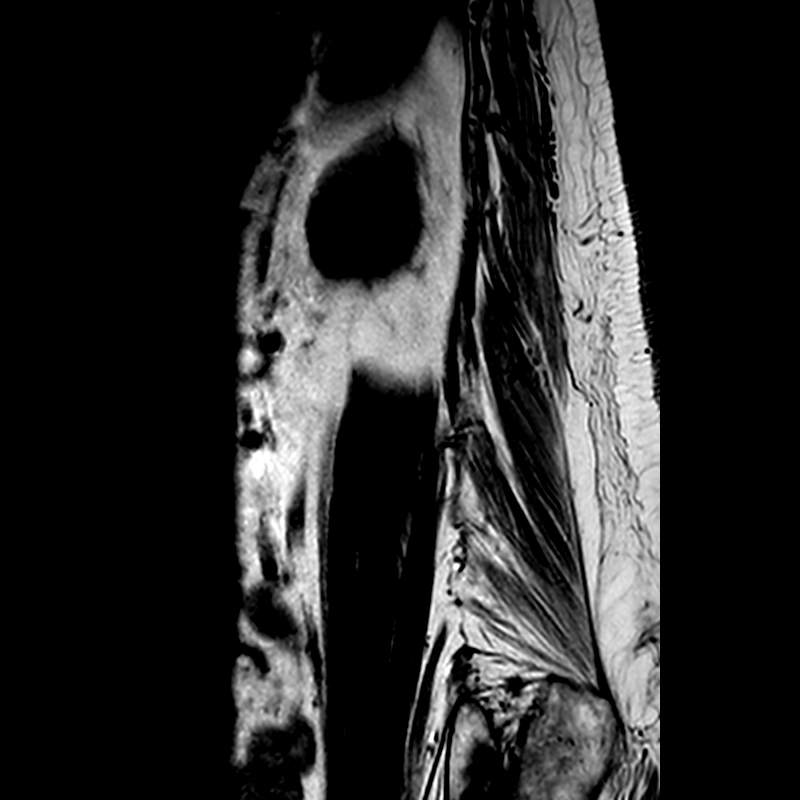

[Series 502: (id)_mdixon_tse · sagittal · 4.0mm · 0.49mm/px · 2 of 19 slices shown]
[im 1/19]
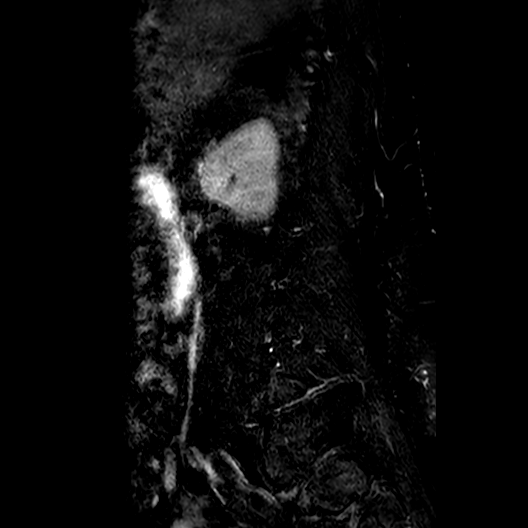
[im 19/19]
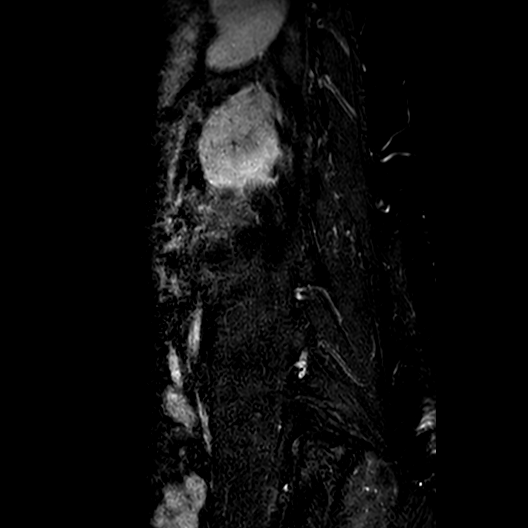

[Series 503: st2w_mdixon_tse · sagittal · 4.0mm · 0.49mm/px · 2 of 19 slices shown]
[im 1/19]
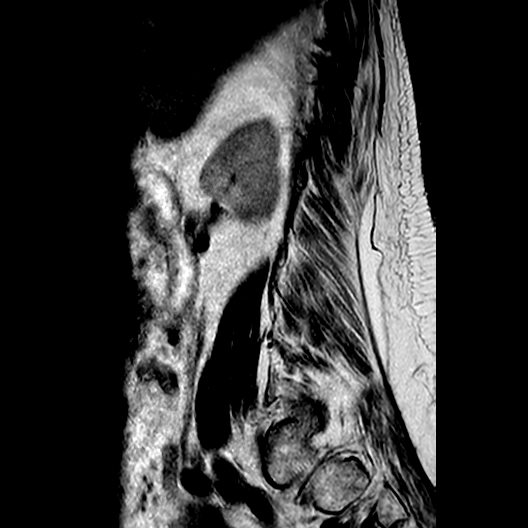
[im 19/19]
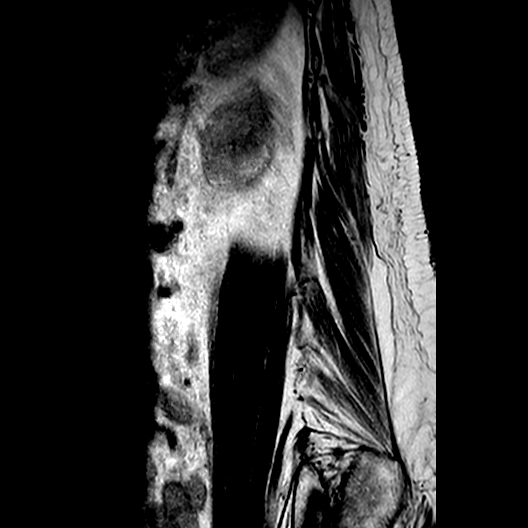

[Series 801: T1 · axial · 4.0mm · 0.39mm/px · z∈[+2,+186]mm · 5 of 42 slices shown]
[im 1/42]
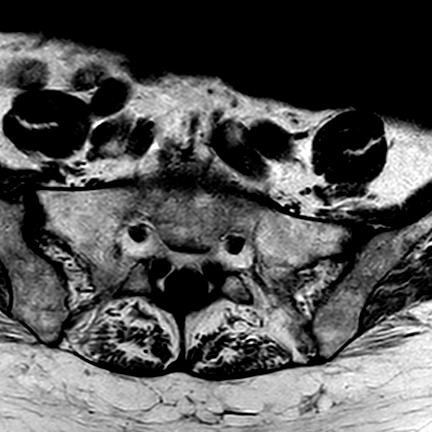
[im 11/42]
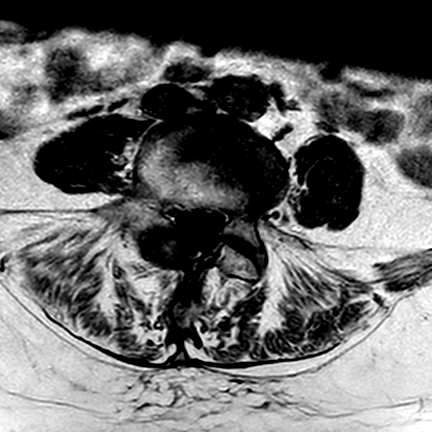
[im 21/42]
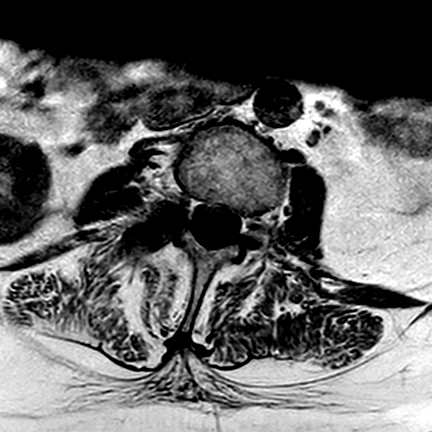
[im 31/42]
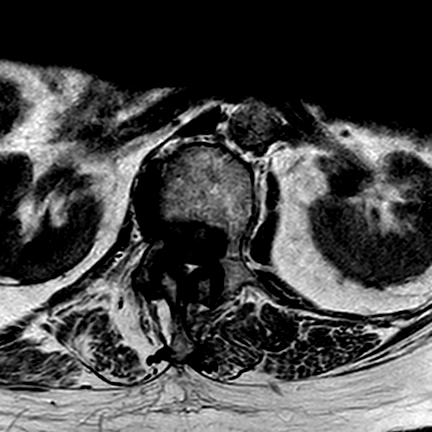
[im 42/42]
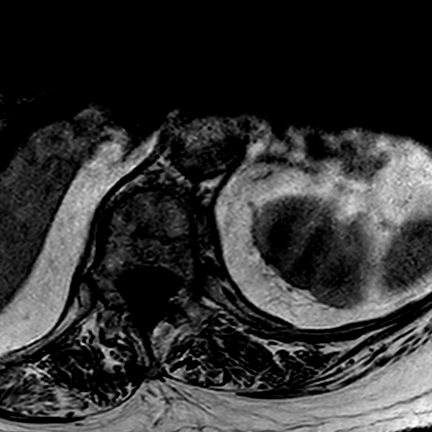

[14 of 48 positions shown; findings below may reference images not displayed]

FINDINGS: -------------------------------------------------------------------------------- 
------ 
GENERAL: 
Nomenclature is based on 5 lumbar type vertebral bodies.     
ALIGNMENT: Moderate levoconvex lumbar scoliosis. Stepwise grade 1 retrolisthesis 
T12 on L1, L1 on L2, L2 on L3 and L3 on L4. There is grade 1 retrolisthesis L5 
on S1. 
VERTEBRAL BODY HEIGHT: Minimal superior endplate compression deformity of L4 
appears chronic. No marrow edema or osseous retropulsion.  
MARROW SIGNAL: No focal suspect signal abnormality. 
CORD SIGNAL: Normal distal spinal cord and cauda equina. Conus medullaris 
terminates at L1-L2. 
ADDITIONAL FINDINGS: Colonic diverticulosis. Distended urinary bladder. 5 mm 
left renal cyst.. Sacral Tarlov cyst. 
Modic I-II: Superior endplate L4, L4-L5 
Ligamentum Flavum > 2.5 mm: All levels. 
-------------------------------------------------------------------------------- 
------ 
SEGMENTAL: Minimal annular bulges at the lower thoracic levels. 
T12-L1: Loss of disc height and signal. Posterior osteophytic ridging with 
annular bulge with mild canal stenosis. Small sized left paracentral disc 
protrusion minimally indents the ventral thecal sac. Mild canal stenosis. 
Foramina patent. Facet arthropathy. 
L1-L2: Loss of disc height and signal. Mild diffuse annular bulge with mild 
canal stenosis. Evidence of central disc extrusion with disc material extending 
inferior to the endplate. There is lateral recess narrowing bilaterally, worse 
on the right, at least moderate. Facet arthropathy. Mild to moderate right 
foraminal narrowing. Mild left foraminal narrowing. 
L2-L3: Loss of disc height and signal. Posterior osteophytic ridging with mild 
annular bulge and mild canal stenosis. Moderate lateral recess narrowing 
bilaterally. Foramina are mildly narrowed. 
L3-L4: Loss of disc height posteriorly. Slight ballooning of the disc. Loss of 
disc signal. Posterior osteophytic ridging with annular bulge and mild canal 
stenosis. There is moderate narrowing of the right lateral recess. Mild left 
foraminal narrowing. Right foramen patent. Facet arthropathy. 
L4-L5: Loss of disc height and signal. Mild diffuse annular bulge. There is 
lateral recess narrowing bilaterally, worse on the left, and at least moderate. 
Mild to moderate canal stenosis. Early trefoil appearance to the thecal sac. 
Facet arthropathy. Mild left foraminal narrowing. Right foramen is patent. 
L5-S1: Loss of disc height and signal with posterior osteophytic ridging and 
minimal annular bulge. Mild narrowing left lateral recess. Canal otherwise 
patent. Mild facet arthropathy. Moderate left and mild right foraminal 
narrowing. 
-------------------------------------------------------------------------------- 
------
IMPRESSION: Multilevel degenerative and scoliotic changes. 
Minimal chronic superior endplate compression deformity of L4. 
No significant central canal narrowing. There is variable multilevel lateral 
recess and foraminal narrowing detailed above.
